# Patient Record
Sex: Male | Born: 1945 | Race: Black or African American | Hispanic: No | Smoking: Former smoker
Health system: Southern US, Community
[De-identification: ages and names within clinical notes are randomized; demographics above are authoritative.]

## PROBLEM LIST (undated history)

## (undated) DIAGNOSIS — E119 Type 2 diabetes mellitus without complications: Secondary | ICD-10-CM

## (undated) DIAGNOSIS — K635 Polyp of colon: Secondary | ICD-10-CM

## (undated) DIAGNOSIS — N200 Calculus of kidney: Secondary | ICD-10-CM

## (undated) DIAGNOSIS — D649 Anemia, unspecified: Secondary | ICD-10-CM

## (undated) DIAGNOSIS — E1140 Type 2 diabetes mellitus with diabetic neuropathy, unspecified: Secondary | ICD-10-CM

## (undated) DIAGNOSIS — M1990 Unspecified osteoarthritis, unspecified site: Secondary | ICD-10-CM

## (undated) DIAGNOSIS — J449 Chronic obstructive pulmonary disease, unspecified: Secondary | ICD-10-CM

## (undated) DIAGNOSIS — I509 Heart failure, unspecified: Secondary | ICD-10-CM

## (undated) DIAGNOSIS — H269 Unspecified cataract: Secondary | ICD-10-CM

## (undated) DIAGNOSIS — N2581 Secondary hyperparathyroidism of renal origin: Secondary | ICD-10-CM

## (undated) DIAGNOSIS — E1121 Type 2 diabetes mellitus with diabetic nephropathy: Secondary | ICD-10-CM

## (undated) DIAGNOSIS — N189 Chronic kidney disease, unspecified: Secondary | ICD-10-CM

## (undated) DIAGNOSIS — I10 Essential (primary) hypertension: Secondary | ICD-10-CM

## (undated) DIAGNOSIS — I639 Cerebral infarction, unspecified: Secondary | ICD-10-CM

## (undated) HISTORY — DX: Type 2 diabetes mellitus without complications: E11.9

## (undated) HISTORY — DX: Chronic kidney disease, unspecified: N18.9

## (undated) HISTORY — DX: Polyp of colon: K63.5

## (undated) HISTORY — DX: Anemia, unspecified: D64.9

## (undated) HISTORY — DX: Type 2 diabetes mellitus with diabetic nephropathy: E11.21

## (undated) HISTORY — DX: Chronic obstructive pulmonary disease, unspecified: J44.9

## (undated) HISTORY — PX: CARDIAC CATHETERIZATION: SHX172

## (undated) HISTORY — DX: Heart failure, unspecified: I50.9

## (undated) HISTORY — DX: Cerebral infarction, unspecified: I63.9

## (undated) HISTORY — DX: Type 2 diabetes mellitus with diabetic neuropathy, unspecified: E11.40

## (undated) HISTORY — DX: Essential (primary) hypertension: I10

## (undated) HISTORY — DX: Unspecified osteoarthritis, unspecified site: M19.90

## (undated) HISTORY — DX: Unspecified cataract: H26.9

## (undated) HISTORY — DX: Secondary hyperparathyroidism of renal origin: N25.81

## (undated) HISTORY — DX: Calculus of kidney: N20.0

---

## 2014-09-09 LAB — LIPID PANEL
CHOLESTEROL: 203 mg/dL — AB (ref 0–200)
HDL: 34 mg/dL — AB (ref 35–70)
LDL CALC: 118 mg/dL
TRIGLYCERIDES: 256 mg/dL — AB (ref 40–160)

## 2014-09-09 LAB — BASIC METABOLIC PANEL
BUN: 46 mg/dL — AB (ref 4–21)
Creatinine: 3.5 mg/dL — AB (ref 0.6–1.3)
Glucose: 320 mg/dL
Potassium: 4.9 mmol/L (ref 3.4–5.3)
Sodium: 139 mmol/L (ref 137–147)

## 2014-09-09 LAB — HEMOGLOBIN A1C: HEMOGLOBIN A1C: 8.3 % — AB (ref 4.0–6.0)

## 2014-09-13 ENCOUNTER — Ambulatory Visit: Payer: Self-pay | Admitting: Nephrology

## 2014-10-21 LAB — BASIC METABOLIC PANEL
BUN: 49 mg/dL — AB (ref 4–21)
Creatinine: 4.4 mg/dL — AB (ref 0.6–1.3)
GLUCOSE: 220 mg/dL
Potassium: 5.2 mmol/L (ref 3.4–5.3)
Sodium: 143 mmol/L (ref 137–147)

## 2014-10-21 LAB — TSH: TSH: 1.71 u[IU]/mL (ref 0.41–5.90)

## 2014-10-21 LAB — HEMOGLOBIN A1C: Hgb A1c MFr Bld: 8.3 % — AB (ref 4.0–6.0)

## 2014-10-24 ENCOUNTER — Encounter: Payer: Self-pay | Admitting: *Deleted

## 2014-10-30 ENCOUNTER — Telehealth: Payer: Self-pay

## 2014-10-30 NOTE — Telephone Encounter (Signed)
This patient has medicaid for insurance.  Do you want him left on your schedule for tomorrow (10/31/14)?

## 2014-10-30 NOTE — Telephone Encounter (Signed)
As far I know, I don't accept [place] [facility]. If he has that insurance, then cancel his appt. Otherwise I do see plain medicare patients.  thanks

## 2014-10-31 ENCOUNTER — Ambulatory Visit: Payer: Self-pay | Admitting: Endocrinology

## 2014-12-31 ENCOUNTER — Ambulatory Visit: Payer: Self-pay | Admitting: Vascular Surgery

## 2014-12-31 LAB — CBC
HCT: 30.4 % — ABNORMAL LOW (ref 40.0–52.0)
HGB: 9.6 g/dL — ABNORMAL LOW (ref 13.0–18.0)
MCH: 29.1 pg (ref 26.0–34.0)
MCHC: 31.7 g/dL — ABNORMAL LOW (ref 32.0–36.0)
MCV: 92 fL (ref 80–100)
Platelet: 248 10*3/uL (ref 150–440)
RBC: 3.31 10*6/uL — AB (ref 4.40–5.90)
RDW: 13.7 % (ref 11.5–14.5)
WBC: 5.8 10*3/uL (ref 3.8–10.6)

## 2014-12-31 LAB — BASIC METABOLIC PANEL
ANION GAP: 5 — AB (ref 7–16)
BUN: 46 mg/dL — AB (ref 7–18)
CALCIUM: 7.9 mg/dL — AB (ref 8.5–10.1)
Chloride: 110 mmol/L — ABNORMAL HIGH (ref 98–107)
Co2: 26 mmol/L (ref 21–32)
Creatinine: 4.74 mg/dL — ABNORMAL HIGH (ref 0.60–1.30)
EGFR (African American): 16 — ABNORMAL LOW
GFR CALC NON AF AMER: 13 — AB
GLUCOSE: 178 mg/dL — AB (ref 65–99)
OSMOLALITY: 298 (ref 275–301)
POTASSIUM: 4.2 mmol/L (ref 3.5–5.1)
Sodium: 141 mmol/L (ref 136–145)

## 2015-01-17 ENCOUNTER — Ambulatory Visit: Payer: Self-pay | Admitting: Vascular Surgery

## 2015-02-18 DEATH — deceased

## 2015-04-20 NOTE — Op Note (Signed)
PATIENT NAME:  Justin DykeICKERSON, Shine MR#:  [id number] DATE OF BIRTH:  21-Jun-1946  DATE OF PROCEDURE:  01/17/2015  PREOPERATIVE DIAGNOSES: 1. End-stage renal disease requiring hemodialysis.  2. Hypertension.   POSTOPERATIVE DIAGNOSES:  1. End-stage renal disease requiring hemodialysis.  2. Hypertension.   PROCEDURE PERFORMED: Creation of a left arm brachiocephalic fistula.   SURGEON: Renford DillsGregory G Cher Egnor, M.D.   ANESTHESIA: General by LMA.   FLUIDS: Per anesthesia record.   ESTIMATED BLOOD LOSS: Minimal.   SPECIMEN: None.   INDICATIONS: [name] is a 69 year old gentleman, who will now require hemodialysis; and therefore, he is undergoing creation of an access. Risks and benefits were reviewed. All questions answered. The patient has agreed to proceed.   DESCRIPTION OF PROCEDURE: The patient is taken to the operating room and placed in the supine position. After adequate general anesthesia is induced and appropriate invasive monitors are placed, he is positioned supine with his left arm extended palm over the left arm. He was prepped and draped in sterile fashion. Appropriate timeout is called.   A curvilinear incision is created along the antecubital crease, and the dissection is carried down to expose the cephalic vein, which is skeletonized proximally and distally, and then marked with a marker.   Through the same incision, working medially, the brachial artery is exposed and it is looped proximally and distally. The cephalic vein is then ligated and divided distally, approximately 2 cm below the antecubital crease. It is then dilated with a Marks olive-tip and irrigated with heparinized saline. Bulldog is placed.   The brachial artery was then delivered into the surgical field. Incision is created with an 11 blade into the artery, which is extended with Potts scissors, and an end vein-to-side brachial artery anastomosis is fashioned with running 6-0 Prolene after 6-0 Prolene stay  sutures are placed. Suture line was completed, flushing maneuvers are performed, and flow was established, 1st to the fistula, then distally. Radial pulse is maintained and an excellent thrill is noted in the vein. The wound is then irrigated and inspected for hemostasis. It was then closed in layers using 3-0 Vicryl for the subcutaneous layer and 4-0 Monocryl for the skin. Dermabond is applied. The patient tolerated the procedure well and there were no immediate complications.    ____________________________ Renford DillsGregory G. Arshdeep Bolger, MD ggs:mw D: 01/17/2015 13:05:25 ET T: 01/17/2015 15:04:43 ET JOB#: [id number]  cc: Renford DillsGregory G. Ashunti Schofield, MD, <Dictator> Munsoor Lizabeth LeydenN. Lateef, MD Renford DillsGREGORY G Paullette Mckain MD ELECTRONICALLY SIGNED 01/22/2015 17:43

## 2016-07-09 IMAGING — CR DG CHEST 2V
1 series · 2 of 2 positions shown · non-contrast
Comparison: None.

CLINICAL DATA: Preoperative examination (dialysis stent). History
of diabetes, CHF, end-stage renal disease.

EXAM:
CHEST  2 VIEW

[Series 1: dxr chest pa (or ap) and lateral · 0.14mm/px · 2 of 2 slices shown]
[im 1/2]
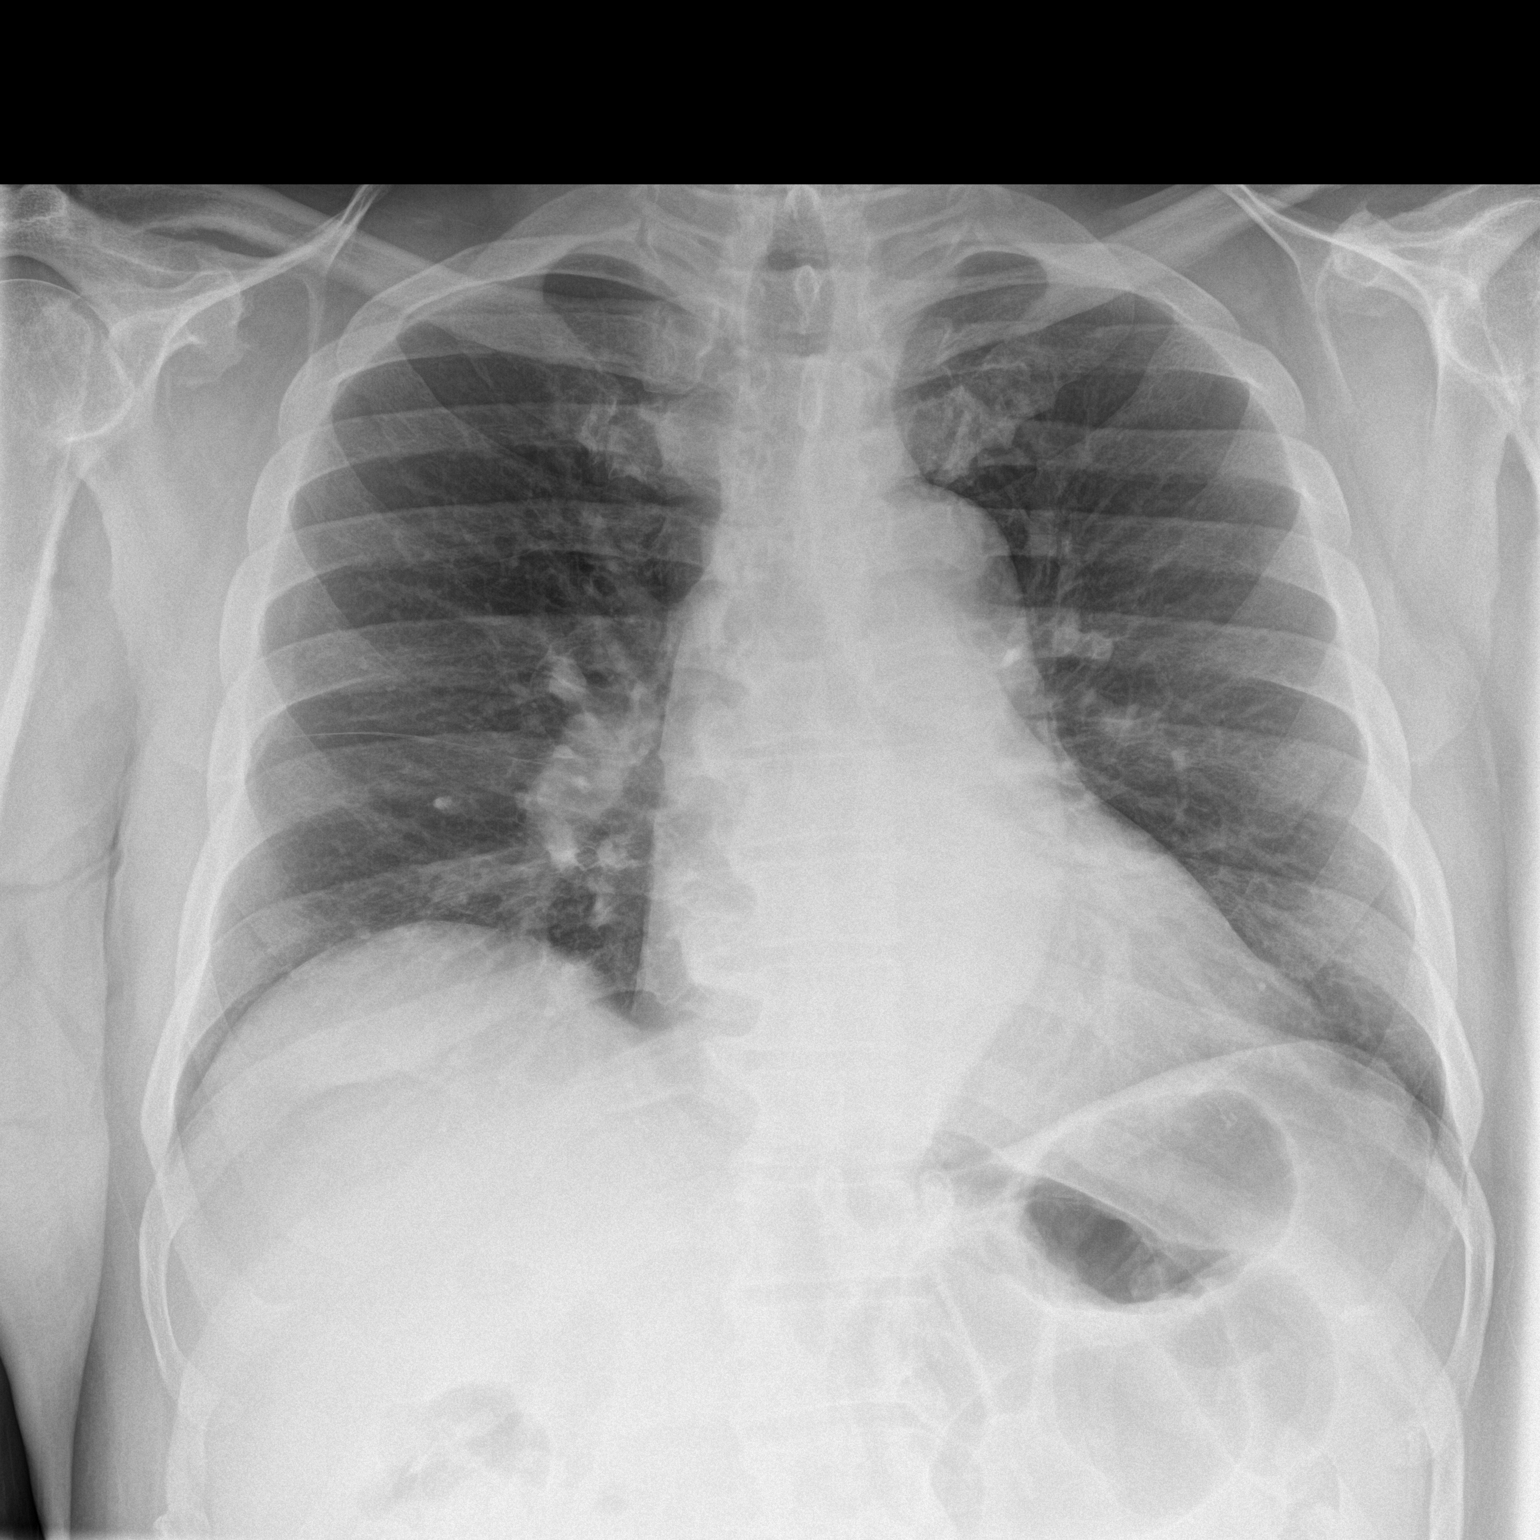
[im 2/2]
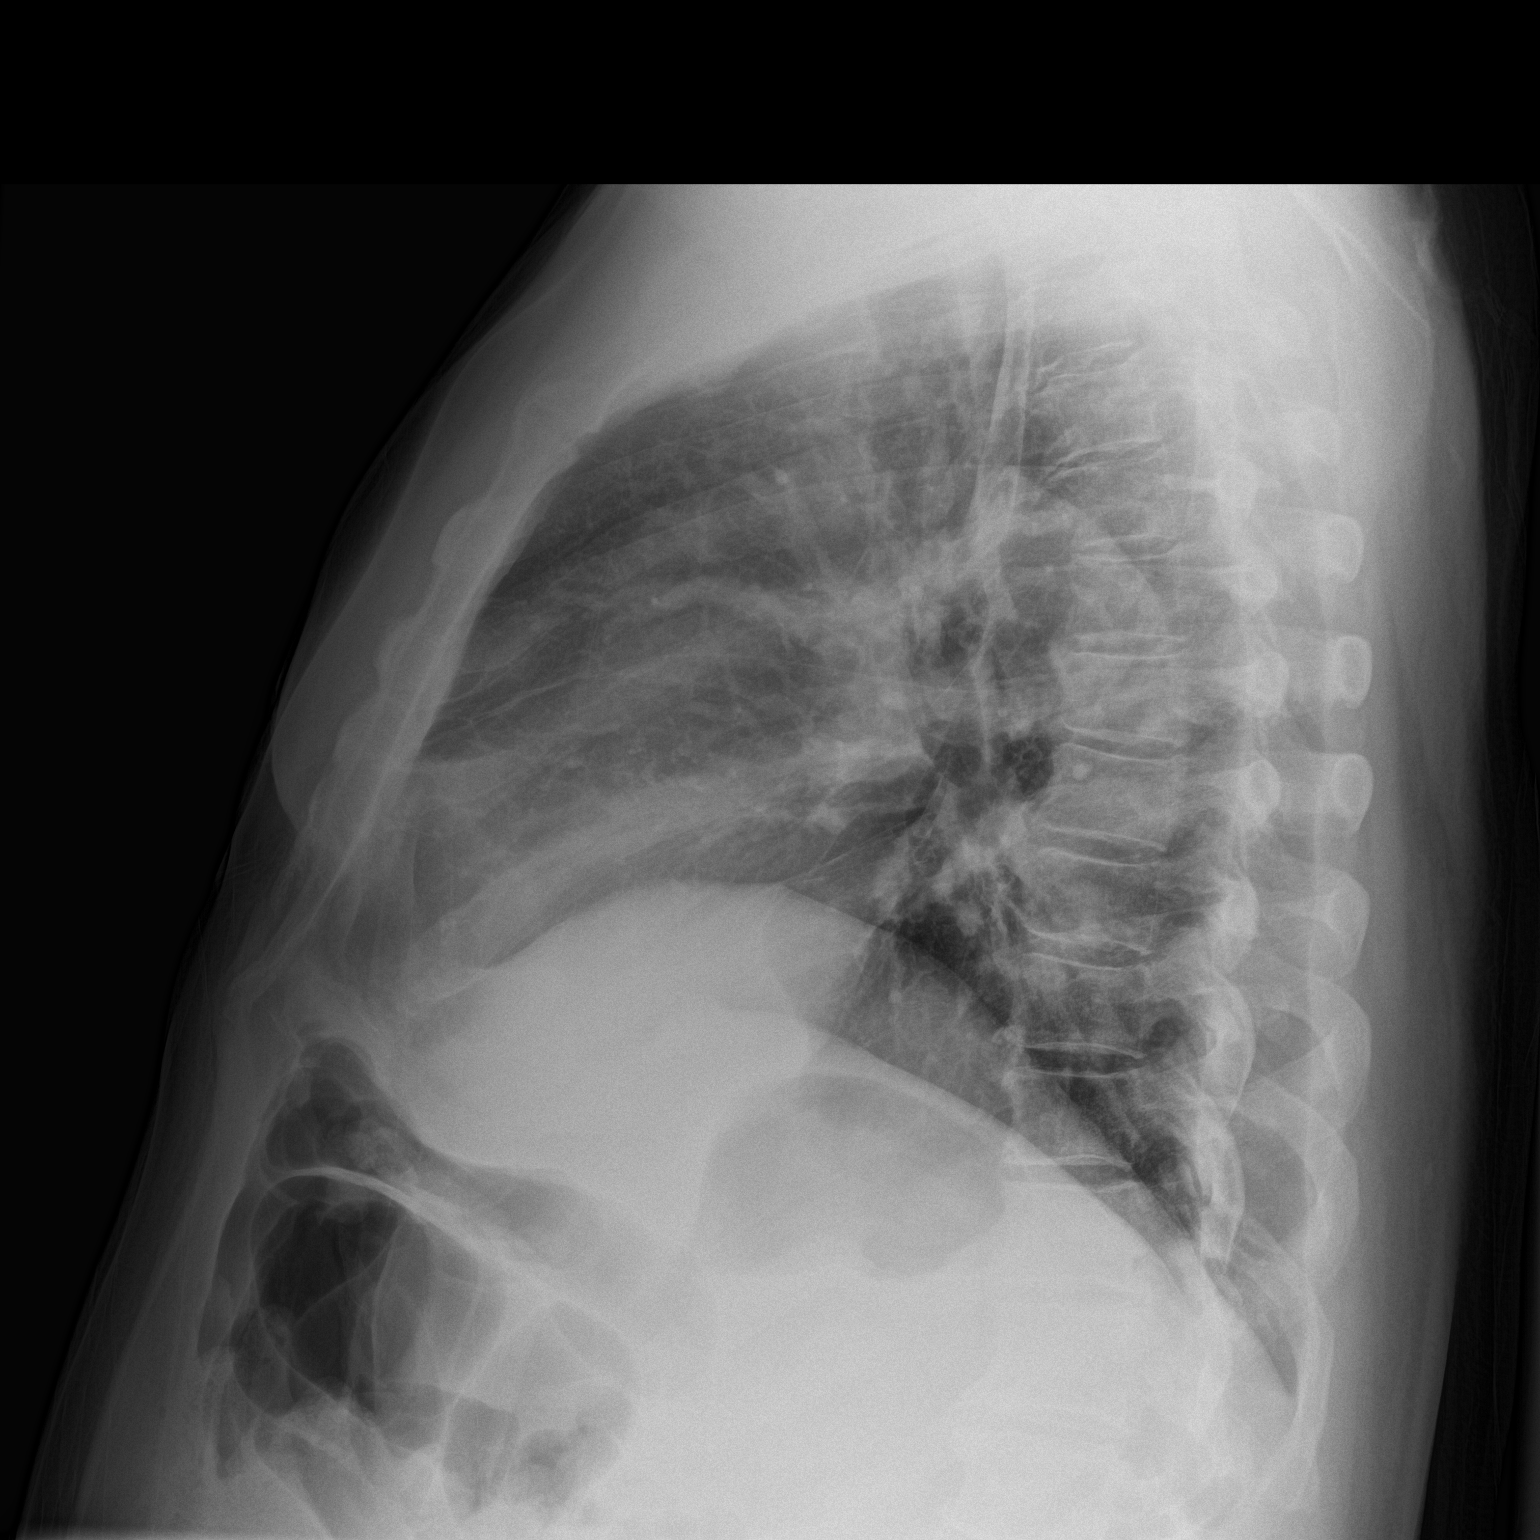

[2 of 2 positions shown; findings below may reference images not displayed]

FINDINGS: Enlarged cardiac silhouette and mediastinal contours with
atherosclerotic plaque within a tortuous and possibly ectatic
thoracic aorta. There is mild prominence of the central pulmonary
vasculature. Pulmonary venous congestion without frank evidence of
edema. There is minimal pleural parenchymal thickening about the
right minor fissure. There is mild elevation/eventration the
bilateral hemidiaphragms, right greater than left. No pleural
effusion or pneumothorax. No acute osseus abnormalities.
IMPRESSION: 1.  No acute cardiopulmonary disease.
2. Cardiomegaly and pulmonary venous congestion without frank
evidence of edema.
